# Patient Record
Sex: Female | Born: 1970 | Hispanic: Yes | Marital: Single | Smoking: Never smoker
Health system: Southern US, Community
[De-identification: ages and names within clinical notes are randomized; demographics above are authoritative.]

---

## 2003-12-07 ENCOUNTER — Ambulatory Visit: Payer: Self-pay | Admitting: Family Medicine

## 2004-01-04 ENCOUNTER — Observation Stay: Payer: Self-pay | Admitting: Obstetrics and Gynecology

## 2004-01-05 ENCOUNTER — Inpatient Hospital Stay: Payer: Self-pay | Admitting: Obstetrics and Gynecology

## 2004-12-31 ENCOUNTER — Emergency Department: Payer: Self-pay | Admitting: Emergency Medicine

## 2004-12-31 ENCOUNTER — Other Ambulatory Visit: Payer: Self-pay

## 2006-12-15 ENCOUNTER — Encounter: Payer: Self-pay | Admitting: Maternal & Fetal Medicine

## 2007-01-19 ENCOUNTER — Encounter: Payer: Self-pay | Admitting: Maternal and Fetal Medicine

## 2007-04-03 ENCOUNTER — Encounter: Payer: Self-pay | Admitting: Maternal & Fetal Medicine

## 2007-04-24 ENCOUNTER — Encounter: Payer: Self-pay | Admitting: Maternal & Fetal Medicine

## 2007-04-24 ENCOUNTER — Observation Stay: Payer: Self-pay | Admitting: Maternal & Fetal Medicine

## 2007-05-01 ENCOUNTER — Observation Stay: Payer: Self-pay | Admitting: Obstetrics and Gynecology

## 2007-05-01 ENCOUNTER — Encounter: Payer: Self-pay | Admitting: Maternal & Fetal Medicine

## 2007-05-15 ENCOUNTER — Encounter: Payer: Self-pay | Admitting: Maternal & Fetal Medicine

## 2007-05-15 ENCOUNTER — Observation Stay: Payer: Self-pay | Admitting: Maternal & Fetal Medicine

## 2007-05-22 ENCOUNTER — Encounter: Payer: Self-pay | Admitting: Maternal & Fetal Medicine

## 2007-05-22 ENCOUNTER — Observation Stay: Payer: Self-pay | Admitting: Maternal & Fetal Medicine

## 2007-05-29 ENCOUNTER — Observation Stay: Payer: Self-pay | Admitting: Maternal & Fetal Medicine

## 2007-05-29 ENCOUNTER — Encounter: Payer: Self-pay | Admitting: Maternal and Fetal Medicine

## 2007-05-29 ENCOUNTER — Encounter: Payer: Self-pay | Admitting: Maternal & Fetal Medicine

## 2009-01-30 ENCOUNTER — Emergency Department: Payer: Self-pay | Admitting: Emergency Medicine

## 2009-02-01 ENCOUNTER — Other Ambulatory Visit: Payer: Self-pay | Admitting: Emergency Medicine

## 2009-03-08 HISTORY — PX: BREAST CYST ASPIRATION: SHX578

## 2009-07-07 ENCOUNTER — Emergency Department: Payer: Self-pay | Admitting: Unknown Physician Specialty

## 2009-07-28 ENCOUNTER — Ambulatory Visit: Payer: Self-pay | Admitting: Internal Medicine

## 2009-08-06 ENCOUNTER — Ambulatory Visit: Payer: Self-pay

## 2009-12-22 ENCOUNTER — Emergency Department: Payer: Self-pay | Admitting: Emergency Medicine

## 2011-04-15 ENCOUNTER — Ambulatory Visit: Payer: Self-pay | Admitting: Internal Medicine

## 2011-12-22 ENCOUNTER — Emergency Department: Payer: Self-pay | Admitting: Emergency Medicine

## 2015-04-16 ENCOUNTER — Ambulatory Visit
Admission: RE | Admit: 2015-04-16 | Discharge: 2015-04-16 | Disposition: A | Discharge status: Home / Self Care | Payer: Self-pay | Source: Ambulatory Visit | Attending: Oncology | Admitting: Oncology

## 2015-04-16 ENCOUNTER — Encounter: Payer: Self-pay | Admitting: *Deleted

## 2015-04-16 ENCOUNTER — Other Ambulatory Visit: Payer: Self-pay | Admitting: *Deleted

## 2015-04-16 ENCOUNTER — Ambulatory Visit: Payer: Self-pay | Attending: Oncology | Admitting: *Deleted

## 2015-04-16 VITALS — BP 126/87 | HR 84 | Temp 98.1°F | Resp 20 | Ht 61.02 in | Wt 165.6 lb

## 2015-04-16 DIAGNOSIS — Z0000 Encounter for general adult medical examination without abnormal findings: Secondary | ICD-10-CM

## 2015-04-16 DIAGNOSIS — N6489 Other specified disorders of breast: Secondary | ICD-10-CM

## 2015-04-16 NOTE — Progress Notes (Signed)
Subjective:     Patient ID: Natasha Ward, female   DOB: January 10, 1971, 45 y.o.   MRN: [id number]  HPI   Review of Systems     Objective:   Physical Exam  Pulmonary/Chest: Right breast exhibits inverted nipple. Right breast exhibits no mass, no nipple discharge, no skin change and no tenderness. Left breast exhibits inverted nipple. Left breast exhibits no mass, no nipple discharge, no skin change and no tenderness. Breasts are symmetrical.         Assessment:     45 year old Hispanic female presents to [facility] for clinical breast exam and mammogram.  [place], the interpreter present during the interview and exam.  Clinical breast exam with symmetrical bilateral thickening noted in the upper outer quadrant and at 12:00.  Taught self breast awareness.  Patient has been screened for eligibility.  She does not have any insurance, Medicare or Medicaid.  She also meets financial eligibility.  Hand-out given on the Affordable Care Act.    Plan:     Screening mammogram ordered.  Will follow-up per protocol.

## 2015-04-16 NOTE — Patient Instructions (Signed)
Gave patient hand-out, Women Staying Healthy, Active and Well from [facility], with education on breast health, pap smears, heart and colon health. 

## 2015-04-28 ENCOUNTER — Telehealth: Payer: Self-pay | Admitting: *Deleted

## 2015-04-28 NOTE — Telephone Encounter (Signed)
Left message via Annice Pih, the interpreter with patient's appointment for additional views on 05/09/15 @ 2:40.  Requested she call me back to confirm receipt of her appointment.

## 2015-04-28 NOTE — Telephone Encounter (Signed)
Talked to patient via Myriam Jacobson the interpreter.  Patient has not been scheduled for her additional views.  Unable to reach anyone in the breast center to schedule her appointment.  Patient does want a late afternoon appointment.  Will notify her as soon as I have an appointment.

## 2015-04-29 ENCOUNTER — Encounter: Payer: Self-pay | Admitting: *Deleted

## 2015-04-29 NOTE — Progress Notes (Signed)
Received phone message from [place], our interpreter that the patient called back and confirmed receipt of her appointment date.

## 2015-05-09 ENCOUNTER — Ambulatory Visit
Admission: RE | Admit: 2015-05-09 | Discharge: 2015-05-09 | Disposition: A | Discharge status: Home / Self Care | Payer: Self-pay | Source: Ambulatory Visit | Attending: Oncology | Admitting: Oncology

## 2015-05-09 DIAGNOSIS — N6489 Other specified disorders of breast: Secondary | ICD-10-CM

## 2015-05-13 ENCOUNTER — Encounter: Payer: Self-pay | Admitting: *Deleted

## 2016-09-29 ENCOUNTER — Ambulatory Visit: Payer: Self-pay | Attending: Oncology

## 2018-04-17 ENCOUNTER — Ambulatory Visit: Payer: Self-pay | Attending: Oncology

## 2021-06-08 ENCOUNTER — Other Ambulatory Visit: Payer: Self-pay

## 2021-06-08 DIAGNOSIS — Z1211 Encounter for screening for malignant neoplasm of colon: Secondary | ICD-10-CM

## 2021-06-08 DIAGNOSIS — Z1231 Encounter for screening mammogram for malignant neoplasm of breast: Secondary | ICD-10-CM

## 2021-06-10 ENCOUNTER — Ambulatory Visit
Admission: RE | Admit: 2021-06-10 | Discharge: 2021-06-10 | Disposition: A | Discharge status: Home / Self Care | Payer: Self-pay | Source: Ambulatory Visit | Attending: Obstetrics and Gynecology | Admitting: Obstetrics and Gynecology

## 2021-06-10 ENCOUNTER — Ambulatory Visit: Payer: Self-pay | Attending: Hematology and Oncology | Admitting: *Deleted

## 2021-06-10 VITALS — BP 121/76 | HR 83 | Resp 18 | Wt 168.0 lb

## 2021-06-10 DIAGNOSIS — Z1231 Encounter for screening mammogram for malignant neoplasm of breast: Secondary | ICD-10-CM | POA: Insufficient documentation

## 2021-06-10 DIAGNOSIS — Z01419 Encounter for gynecological examination (general) (routine) without abnormal findings: Secondary | ICD-10-CM

## 2021-06-10 NOTE — Patient Instructions (Signed)
Explained breast self awareness with [name]. Pap smear completed today. Let her know [facility] will cover Pap smears and HPV typing every 5 years unless has a history of abnormal Pap smears. Referred patient to the [facility] for a screening mammogram. Appointment scheduled Wednesday, June 10, 2021 at 1520. Patient aware of appointment and will be there. Let patient know will follow up with her within the next couple weeks with results of Pap smear by phone. Informed patient that Delford Field will follow up with her within the next couple of weeks with results of her mammogram by letter or phone. Natasha Ward verbalized understanding. ? ?Rosali Augello, Kathaleen Maser, RN ?3:04 PM ? ? ? ? ? ?

## 2021-06-10 NOTE — Progress Notes (Signed)
[name] is a 51 y.o. No obstetric history on file. female who presents to [facility] clinic today with no complaints.  ?  ?Pap Smear: Pap smear completed today. Last Pap smear was 04/03/2015 at [facility] clinic and was normal. Per patient has no history of an abnormal Pap smear. Last Pap smear result is available in Epic. ?  ?Physical exam: ?Breasts ?Breasts symmetrical. No skin abnormalities bilateral breasts. No nipple retraction right breast. Left nipple inverted that per patient is normal for her. No nipple discharge bilateral breasts. No lymphadenopathy. No lumps palpated bilateral breasts. No complaints of pain or tenderness on exam. ?  ?Pelvic/Bimanual ?Ext Genitalia ?No lesions, no swelling and no discharge observed on external genitalia.      ?  ?Vagina ?Vagina pink and normal texture. No lesions or discharge observed in vagina.      ?  ?Cervix ?Cervix is present. Cervix pink and of normal texture. No discharge observed.  ?  ?Uterus ?Uterus is present and palpable. Uterus in normal position and normal size.      ?  ?Adnexae ?Bilateral ovaries present and palpable. No tenderness on palpation.       ?  ?Rectovaginal ?No rectal exam completed today since patient had no rectal complaints. No skin abnormalities observed on exam.   ?  ?Smoking History: ?Patient has never smoked. ?  ?Patient Navigation: ?Patient education provided. Access to services provided for patient through [employer] program. Spanish interpreter Delos Haring from [facility] provided.  ? ?Colorectal Cancer Screening: ?Per patient has never had colonoscopy completed. Patient refused FIT Test today. No complaints today.  ?  ?Breast and Cervical Cancer Risk Assessment: ?Patient does not have family history of breast cancer, known genetic mutations, or radiation treatment to the chest before age 36. Patient does not have history of cervical dysplasia, immunocompromised, or DES exposure in-utero. ? ?Risk Assessment   ? ? Risk  Scores   ? ?   06/10/2021  ? Last edited by: Lesle Chris, RN  ? 5-year risk: 0.5 %  ? Lifetime risk: 4.4 %  ? ?  ?  ? ?  ? ? ?A: ?[facility] exam with pap smear ?No complaints. ? ?P: ?Referred patient to the [facility] for a screening mammogram. Appointment scheduled Wednesday, June 10, 2021 at 1520. ? ?Priscille Heidelberg, RN ?06/10/2021 3:04 PM   ?

## 2021-06-11 LAB — CYTOLOGY - PAP
Comment: NEGATIVE
Diagnosis: NEGATIVE
High risk HPV: NEGATIVE

## 2021-06-12 ENCOUNTER — Telehealth: Payer: Self-pay

## 2021-06-12 NOTE — Telephone Encounter (Addendum)
Via Annice Pih, Spanish Interpreter [facility]), Patient informed negative Pap/HPV results, next pap due in 5 years. Patient verbalized understanding.  ?

## 2022-06-23 ENCOUNTER — Other Ambulatory Visit: Payer: Self-pay

## 2022-06-23 DIAGNOSIS — Z1231 Encounter for screening mammogram for malignant neoplasm of breast: Secondary | ICD-10-CM

## 2022-07-05 ENCOUNTER — Ambulatory Visit
Admission: RE | Admit: 2022-07-05 | Discharge: 2022-07-05 | Disposition: A | Discharge status: Home / Self Care | Payer: Self-pay | Source: Ambulatory Visit | Attending: Obstetrics and Gynecology | Admitting: Obstetrics and Gynecology

## 2022-07-05 ENCOUNTER — Ambulatory Visit: Payer: Self-pay | Attending: Hematology and Oncology | Admitting: Hematology and Oncology

## 2022-07-05 VITALS — BP 118/83 | Wt 176.0 lb

## 2022-07-05 DIAGNOSIS — Z1231 Encounter for screening mammogram for malignant neoplasm of breast: Secondary | ICD-10-CM

## 2022-07-05 NOTE — Progress Notes (Signed)
[name] is a 52 y.o. female who presents to [facility] clinic today with no complaints.    Pap Smear: Pap not smear completed today. Last Pap smear was 2023 at [facility] clinic and was normal. Per patient has no history of an abnormal Pap smear. Last Pap smear result is available in Epic.   Physical exam: Breasts Breasts symmetrical. No skin abnormalities bilateral breasts. No nipple retraction bilateral breasts. No nipple discharge bilateral breasts. No lymphadenopathy. No lumps palpated bilateral breasts.  MS DIGITAL SCREENING TOMO BILATERAL  Result Date: 06/11/2021 CLINICAL DATA:  Screening. EXAM: DIGITAL SCREENING BILATERAL MAMMOGRAM WITH TOMOSYNTHESIS AND CAD TECHNIQUE: Bilateral screening digital craniocaudal and mediolateral oblique mammograms were obtained. Bilateral screening digital breast tomosynthesis was performed. The images were evaluated with computer-aided detection. COMPARISON:  Previous exam(s). ACR Breast Density Category c: The breast tissue is heterogeneously dense, which may obscure small masses. FINDINGS: There are no findings suspicious for malignancy. IMPRESSION: No mammographic evidence of malignancy. A result letter of this screening mammogram will be mailed directly to the patient. RECOMMENDATION: Screening mammogram in one year. (Code:SM-B-01Y) BI-RADS CATEGORY  1: Negative. Electronically Signed   By: Harmon Pier M.D.   On: 06/11/2021 11:00         Pelvic/Bimanual Pap is not indicated today    Smoking History: Patient has never smoked and was not referred to quit line.    Patient Navigation: Patient education provided. Access to services provided for patient through [facility] program. Delos Haring interpreter provided. No transportation provided   Colorectal Cancer Screening: Per patient has never had colonoscopy completed No complaints today. FIT test negative per [facility] Real 2024.   Breast and Cervical Cancer Risk Assessment: Patient does not have  family history of breast cancer, known genetic mutations, or radiation treatment to the chest before age 55. Patient does not have history of cervical dysplasia, immunocompromised, or DES exposure in-utero.  Risk Assessment   No risk assessment data for the current encounter  Risk Scores       06/10/2021   Last edited by: Lesle Chris, RN   5-year risk: 0.5 %   Lifetime risk: 4.4 %            A: [facility] exam without pap smear No complaints with benign exam.   P: Referred patient to the [facility] for a screening mammogram. Appointment scheduled 07/05/22.  Ilda Basset A, NP 07/05/2022 2:25 PM

## 2022-07-05 NOTE — Patient Instructions (Signed)
Taught Natasha Ward about self breast awareness and gave educational materials to take home. Patient did not need a Pap smear today due to last Pap smear was in 2023 per patient. Let her know [facility] will cover Pap smears every 5 years unless has a history of abnormal Pap smears. Referred patient to the [facility] for diagnostic mammogram. Appointment scheduled for 07/05/22. Patient aware of appointment and will be there. Let patient know will follow up with her within the next couple weeks with results. Natasha Ward verbalized understanding.  Pascal Lux, NP 2:25 PM

## 2023-06-02 ENCOUNTER — Telehealth: Payer: Self-pay | Admitting: *Deleted

## 2023-06-24 ENCOUNTER — Encounter: Payer: Self-pay | Admitting: Family Medicine

## 2023-07-06 ENCOUNTER — Telehealth: Payer: Self-pay | Admitting: *Deleted

## 2024-03-13 IMAGING — MG MM DIGITAL SCREENING BILAT W/ TOMO AND CAD
6 of 10 series · 6 of 30 positions shown · non-contrast
Comparison: Previous exam(s).

CLINICAL DATA: Screening.

EXAM:
DIGITAL SCREENING BILATERAL MAMMOGRAM WITH TOMOSYNTHESIS AND CAD
TECHNIQUE: Bilateral screening digital craniocaudal and mediolateral oblique
mammograms were obtained. Bilateral screening digital breast
tomosynthesis was performed. The images were evaluated with
computer-aided detection.

[L MLO synth-2D (1 of 2)]
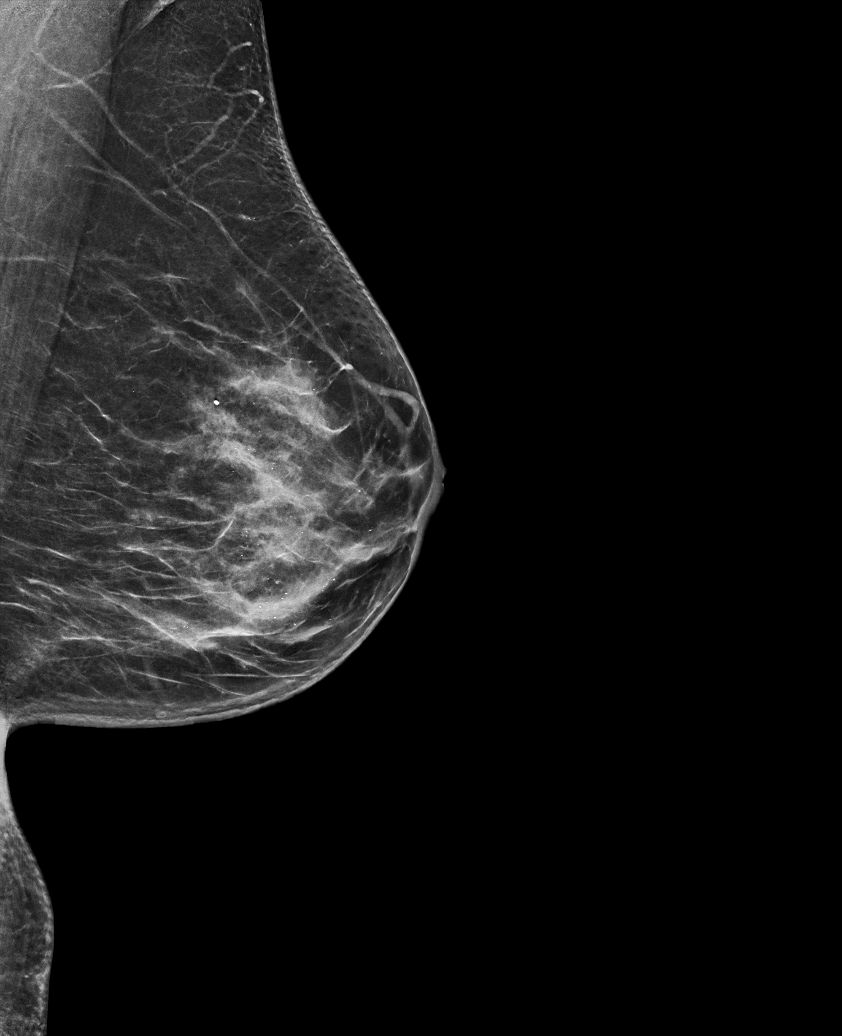

[L CC synth-2D]
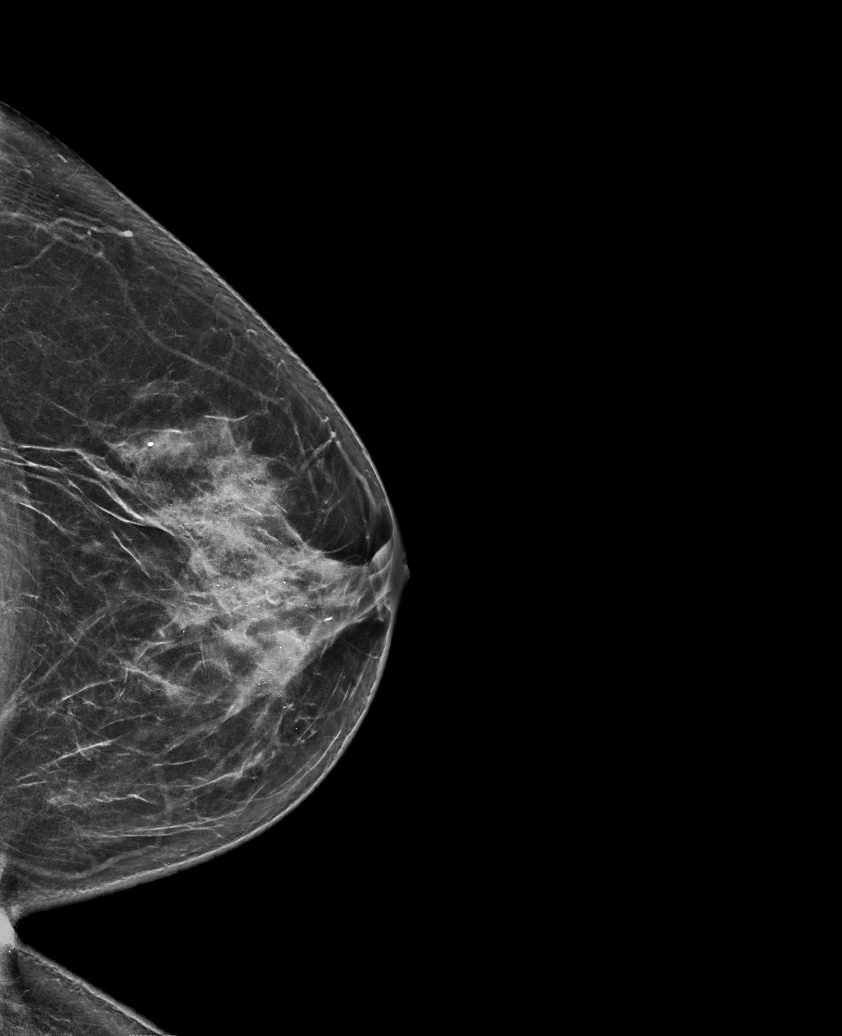

[R MLO synth-2D]
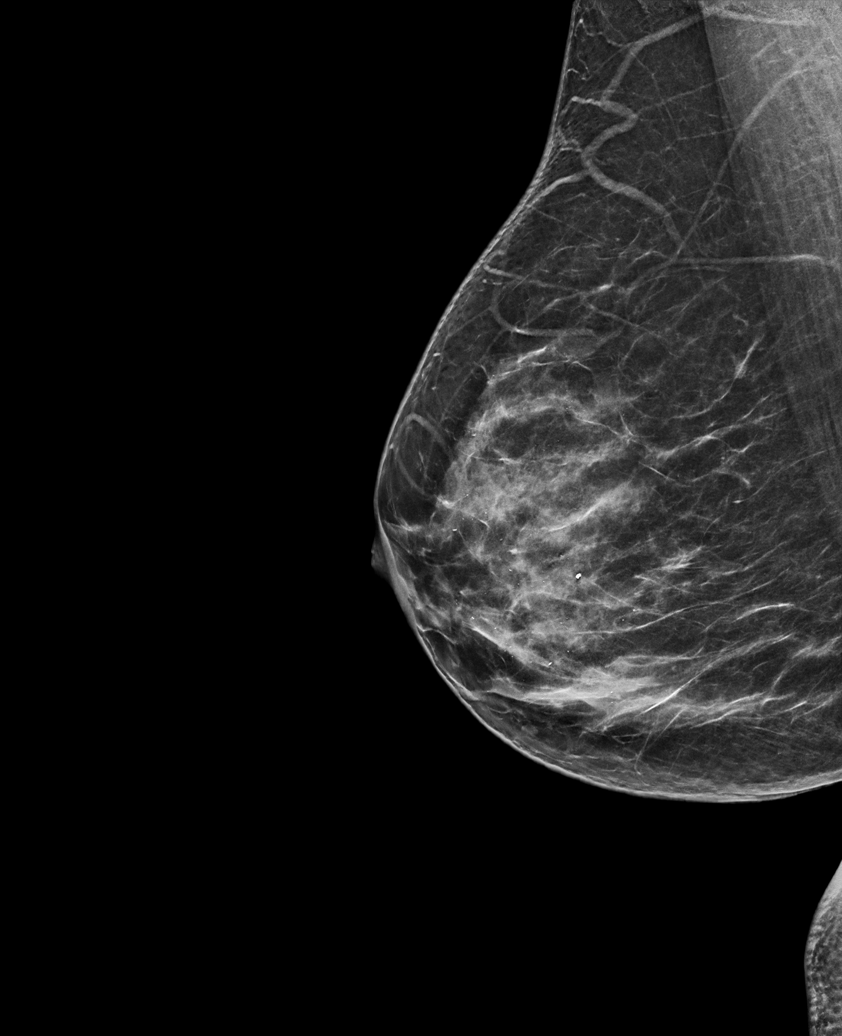

[L MLO synth-2D (2 of 2)]
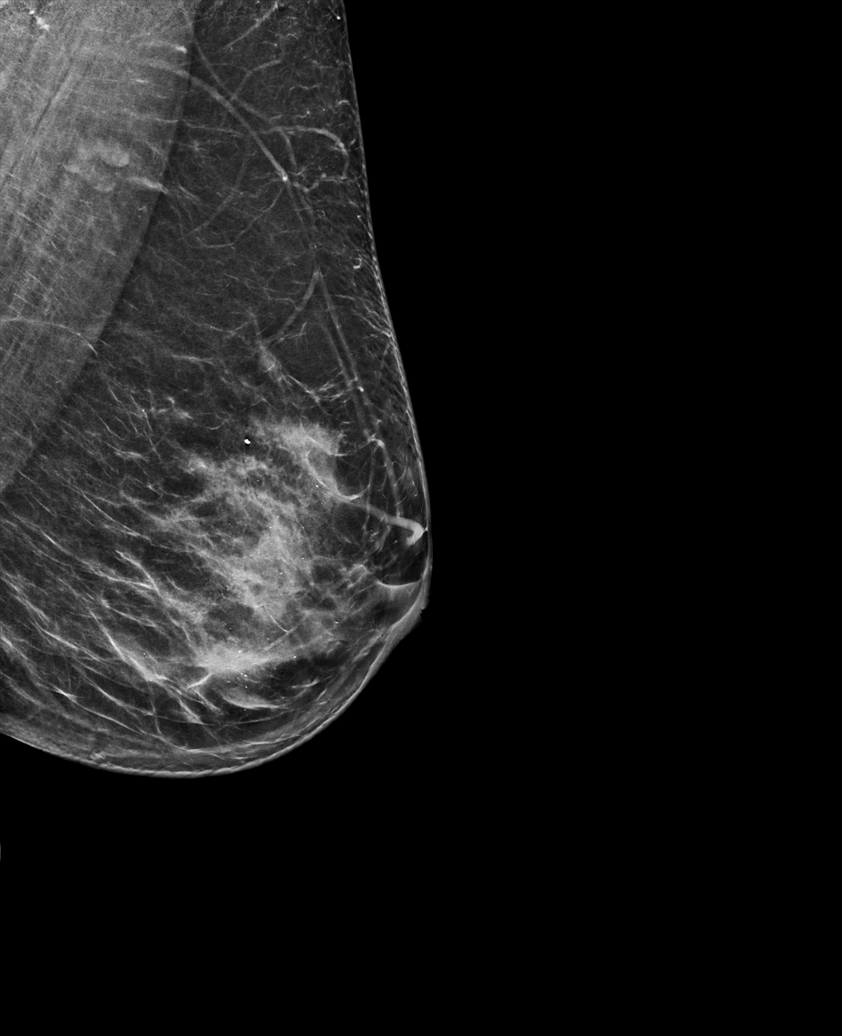

[R CC synth-2D]
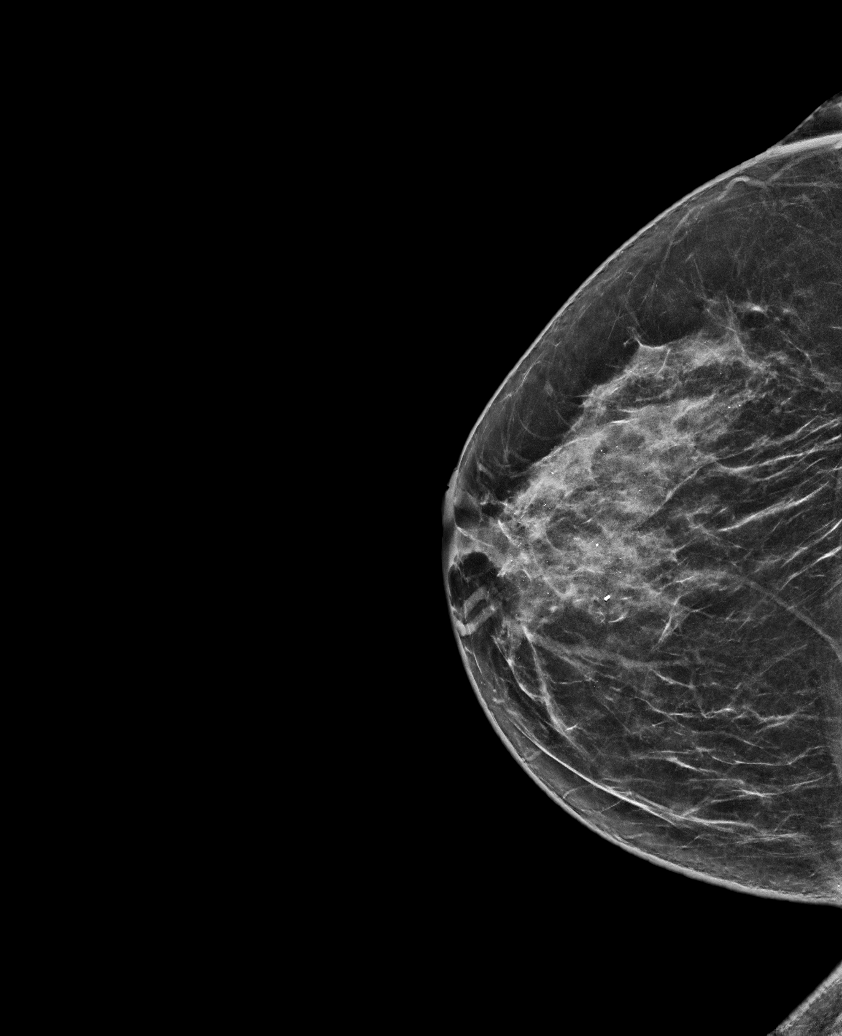

[R MLO tomo · tomo slice 36/71.0]
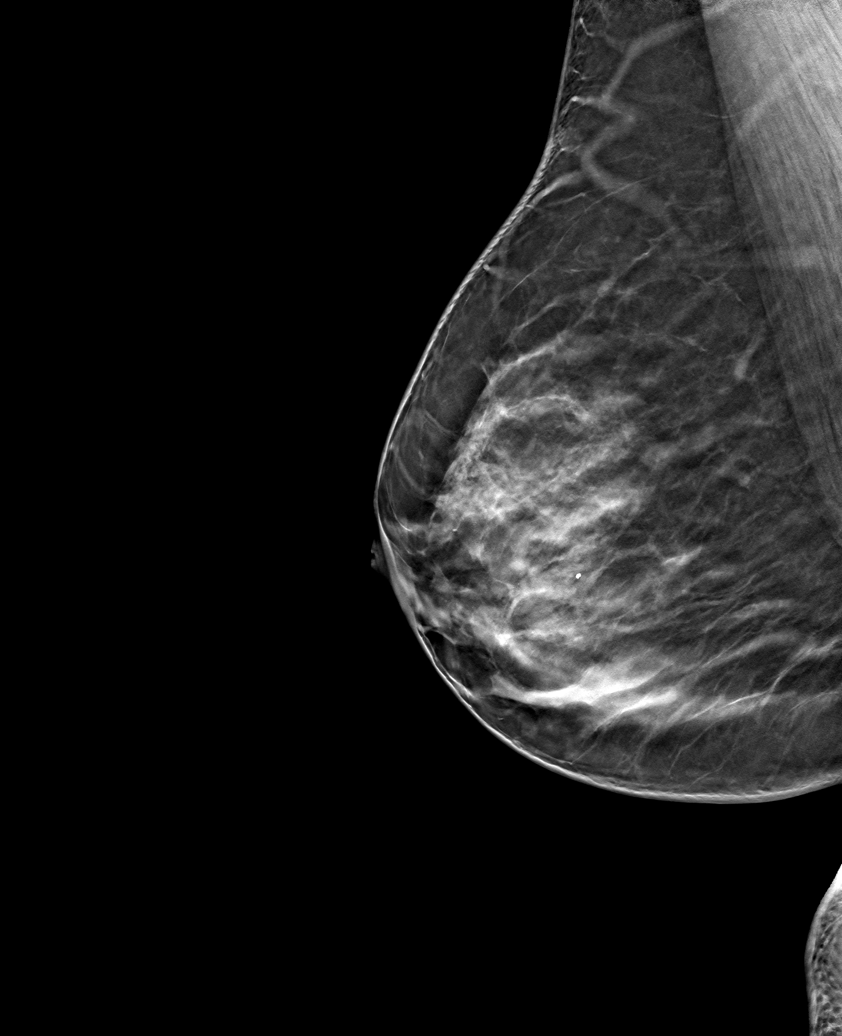

[6 of 30 positions shown; findings below may reference images not displayed]

ACR Breast Density Category c: The breast tissue is heterogeneously
dense, which may obscure small masses.
FINDINGS: There are no findings suspicious for malignancy.
IMPRESSION: No mammographic evidence of malignancy. A result letter of this
screening mammogram will be mailed directly to the patient.

RECOMMENDATION:
Screening mammogram in one year. (Code:Q3-W-BC3)

BI-RADS CATEGORY  1: Negative.
